# Patient Record
Sex: Female | Born: 1980 | Race: Black or African American | Hispanic: No | Marital: Married | Smoking: Never smoker
Health system: Southern US, Community
[De-identification: ages and names within clinical notes are randomized; demographics above are authoritative.]

## PROBLEM LIST (undated history)

## (undated) DIAGNOSIS — I10 Essential (primary) hypertension: Secondary | ICD-10-CM

## (undated) DIAGNOSIS — F419 Anxiety disorder, unspecified: Secondary | ICD-10-CM

## (undated) DIAGNOSIS — J45909 Unspecified asthma, uncomplicated: Secondary | ICD-10-CM

---

## 2018-02-22 ENCOUNTER — Encounter (HOSPITAL_COMMUNITY): Payer: Self-pay

## 2018-02-22 ENCOUNTER — Other Ambulatory Visit: Payer: Self-pay

## 2018-02-22 ENCOUNTER — Emergency Department (HOSPITAL_COMMUNITY)
Admission: EM | Admit: 2018-02-22 | Discharge: 2018-02-22 | Disposition: A | Discharge status: Home / Self Care | Payer: 59 | Attending: Emergency Medicine | Admitting: Emergency Medicine

## 2018-02-22 ENCOUNTER — Emergency Department (HOSPITAL_COMMUNITY): Payer: 59

## 2018-02-22 DIAGNOSIS — I10 Essential (primary) hypertension: Secondary | ICD-10-CM | POA: Diagnosis not present

## 2018-02-22 DIAGNOSIS — R002 Palpitations: Secondary | ICD-10-CM | POA: Diagnosis not present

## 2018-02-22 DIAGNOSIS — J45909 Unspecified asthma, uncomplicated: Secondary | ICD-10-CM | POA: Insufficient documentation

## 2018-02-22 DIAGNOSIS — R079 Chest pain, unspecified: Secondary | ICD-10-CM | POA: Diagnosis present

## 2018-02-22 HISTORY — DX: Unspecified asthma, uncomplicated: J45.909

## 2018-02-22 HISTORY — DX: Anxiety disorder, unspecified: F41.9

## 2018-02-22 HISTORY — DX: Essential (primary) hypertension: I10

## 2018-02-22 LAB — BASIC METABOLIC PANEL
Anion gap: 7 (ref 5–15)
BUN: 13 mg/dL (ref 6–20)
CO2: 25 mmol/L (ref 22–32)
Calcium: 8.8 mg/dL — ABNORMAL LOW (ref 8.9–10.3)
Chloride: 104 mmol/L (ref 98–111)
Creatinine, Ser: 0.92 mg/dL (ref 0.44–1.00)
GFR calc Af Amer: >60 mL/min (ref >60)
GFR calc non Af Amer: >60 mL/min (ref >60)
Glucose, Bld: 93 mg/dL (ref 70–99)
Potassium: 3.2 mmol/L — ABNORMAL LOW (ref 3.5–5.1)
Sodium: 136 mmol/L (ref 135–145)

## 2018-02-22 LAB — I-STAT BETA HCG BLOOD, ED (MC, WL, AP ONLY): I-stat hCG, quantitative: <5 m[IU]/mL (ref <5)

## 2018-02-22 LAB — CBC
HCT: 37.5 % (ref 36.0–46.0)
Hemoglobin: 11.3 g/dL — ABNORMAL LOW (ref 12.0–15.0)
MCH: 23.3 pg — ABNORMAL LOW (ref 26.0–34.0)
MCHC: 30.1 g/dL (ref 30.0–36.0)
MCV: 77.5 fL — ABNORMAL LOW (ref 80.0–100.0)
Platelets: 486 10*3/uL — ABNORMAL HIGH (ref 150–400)
RBC: 4.84 MIL/uL (ref 3.87–5.11)
RDW: 15.9 % — ABNORMAL HIGH (ref 11.5–15.5)
WBC: 13.8 10*3/uL — ABNORMAL HIGH (ref 4.0–10.5)
nRBC: 0 % (ref 0.0–0.2)

## 2018-02-22 LAB — TROPONIN I: Troponin I: <0.03 ng/mL (ref <0.03)

## 2018-02-22 MED ORDER — POTASSIUM CHLORIDE CRYS ER 20 MEQ PO TBCR
40.0000 meq | EXTENDED_RELEASE_TABLET | Freq: Once | ORAL | Status: AC
  Administered 2018-02-22: 40 meq via ORAL
  Filled 2018-02-22: qty 2

## 2018-02-22 NOTE — ED Provider Notes (Signed)
[facility] EMERGENCY DEPARTMENT Provider Note   CSN: [id number] Arrival date & time: 02/22/18  1728     History   Chief Complaint Chief Complaint  Patient presents with  . Chest Pain    HPI Weldon InchesLatoya Harding is a 37 y.o. female.  HPI   37 year old female with palpitations.  She will occasionally feel like her heart skips a beat.  She has not noticed any appreciable exacerbating relieving factors to this.  No sustained palpitations.  No shortness of breath.  No fevers or chills.  No unusual leg pain or swelling.  Denies any new medications over-the-counter or otherwise.  Denies heavy caffeine usage.  Denies illicit drug use.  Past Medical History:  Diagnosis Date  . Anxiety   . Asthma   . Hypertension     There are no active problems to display for this patient.   History reviewed. No pertinent surgical history.   OB History   No obstetric history on file.      Home Medications    Prior to Admission medications   Not on File    Family History No family history on file.  Social History Social History   Tobacco Use  . Smoking status: Never Smoker  Substance Use Topics  . Alcohol use: Never    Frequency: Never  . Drug use: Never     Allergies   Aspirin   Review of Systems Review of Systems  All systems reviewed and negative, other than as noted in HPI.  Physical Exam Updated Vital Signs BP 122/75 (BP Location: Right Arm)   Pulse (!) 115   Temp 98.2 F (36.8 C)   Resp 12   Ht 5\' 4"  (1.626 m)   Wt 127 kg   LMP 02/21/2018   SpO2 95%   BMI 48.06 kg/m   Physical Exam Vitals signs and nursing note reviewed.  Constitutional:      General: She is not in acute distress.    Appearance: She is well-developed. She is obese. She is not ill-appearing, toxic-appearing or diaphoretic.  HENT:     Head: Normocephalic and atraumatic.  Eyes:     General:        Right eye: No discharge.        Left eye: No discharge.     Conjunctiva/sclera:  Conjunctivae normal.  Neck:     Musculoskeletal: Neck supple.  Cardiovascular:     Rate and Rhythm: Normal rate and regular rhythm.     Heart sounds: Normal heart sounds. No murmur. No friction rub. No gallop.   Pulmonary:     Effort: Pulmonary effort is normal. No respiratory distress.     Breath sounds: Normal breath sounds.  Abdominal:     General: There is no distension.     Palpations: Abdomen is soft.     Tenderness: There is no abdominal tenderness.  Musculoskeletal:        General: No tenderness.     Comments: Lower extremities symmetric as compared to each other. No calf tenderness. Negative Homan's. No palpable cords.   Skin:    General: Skin is warm and dry.  Neurological:     Mental Status: She is alert.  Psychiatric:        Behavior: Behavior normal.        Thought Content: Thought content normal.      ED Treatments / Results  Labs (all labs ordered are listed, but only abnormal results are displayed) Labs Reviewed  BASIC METABOLIC PANEL -  Abnormal; Notable for the following components:      Result Value   Potassium 3.2 (*)    Calcium 8.8 (*)    All other components within normal limits  CBC - Abnormal; Notable for the following components:   WBC 13.8 (*)    Hemoglobin 11.3 (*)    MCV 77.5 (*)    MCH 23.3 (*)    RDW 15.9 (*)    Platelets 486 (*)    All other components within normal limits  TROPONIN I  I-STAT BETA HCG BLOOD, ED ([facility], [facility], [facility] ONLY)    EKG EKG Interpretation  Date/Time:  Sunday February 22 2018 17:42:52 EST Ventricular Rate:  103 PR Interval:  164 QRS Duration: 70 QT Interval:  356 QTC Calculation: 466 R Axis:   48 Text Interpretation:  Sinus tachycardia Otherwise normal ECG Confirmed by Jacey Pelc ([id number]) on 02/22/2018 6:38:39 PM   Radiology No results found.  Procedures Procedures (including critical care time)  Medications Ordered in ED Medications - No data to display   Initial Impression / Assessment and Plan /  ED Course  I have reviewed the triage vital signs and the nursing notes.  Pertinent labs & imaging results that were available during my care of the patient were reviewed by me and considered in my medical decision making (see chart for details).     37  year old female with palpitations.  From her description, I suspect she may be feeling PVCs.  No significant ectopy noted while in the emergency room though.  ED work-up fairly unremarkable aside from mild hypokalemia.  I doubt emergent process.  It has been determined that no acute conditions requiring further emergency intervention are present at this time. The patient has been advised of the diagnosis and plan. I reviewed any labs and imaging including any potential incidental findings. We have discussed signs and symptoms that warrant return to the ED and they are listed in the discharge instructions.    Final Clinical Impressions(s) / ED Diagnoses   Final diagnoses:  Palpitations    ED Discharge Orders    None       Raeford Razor, MD 03/05/18 [id number]

## 2018-02-22 NOTE — ED Triage Notes (Signed)
Pt reports chest pain for 3 days. Also had experienced anxiety for 3 days which is better. Pt reports that intermittent hard beats in mid chest. At times feels like chest is heavy

## 2019-07-10 IMAGING — DX DG CHEST 2V
2 series · 2 of 2 positions shown · non-contrast
Comparison: None.

CLINICAL DATA: Chest pain

EXAM:
CHEST - 2 VIEW

[chest pa]
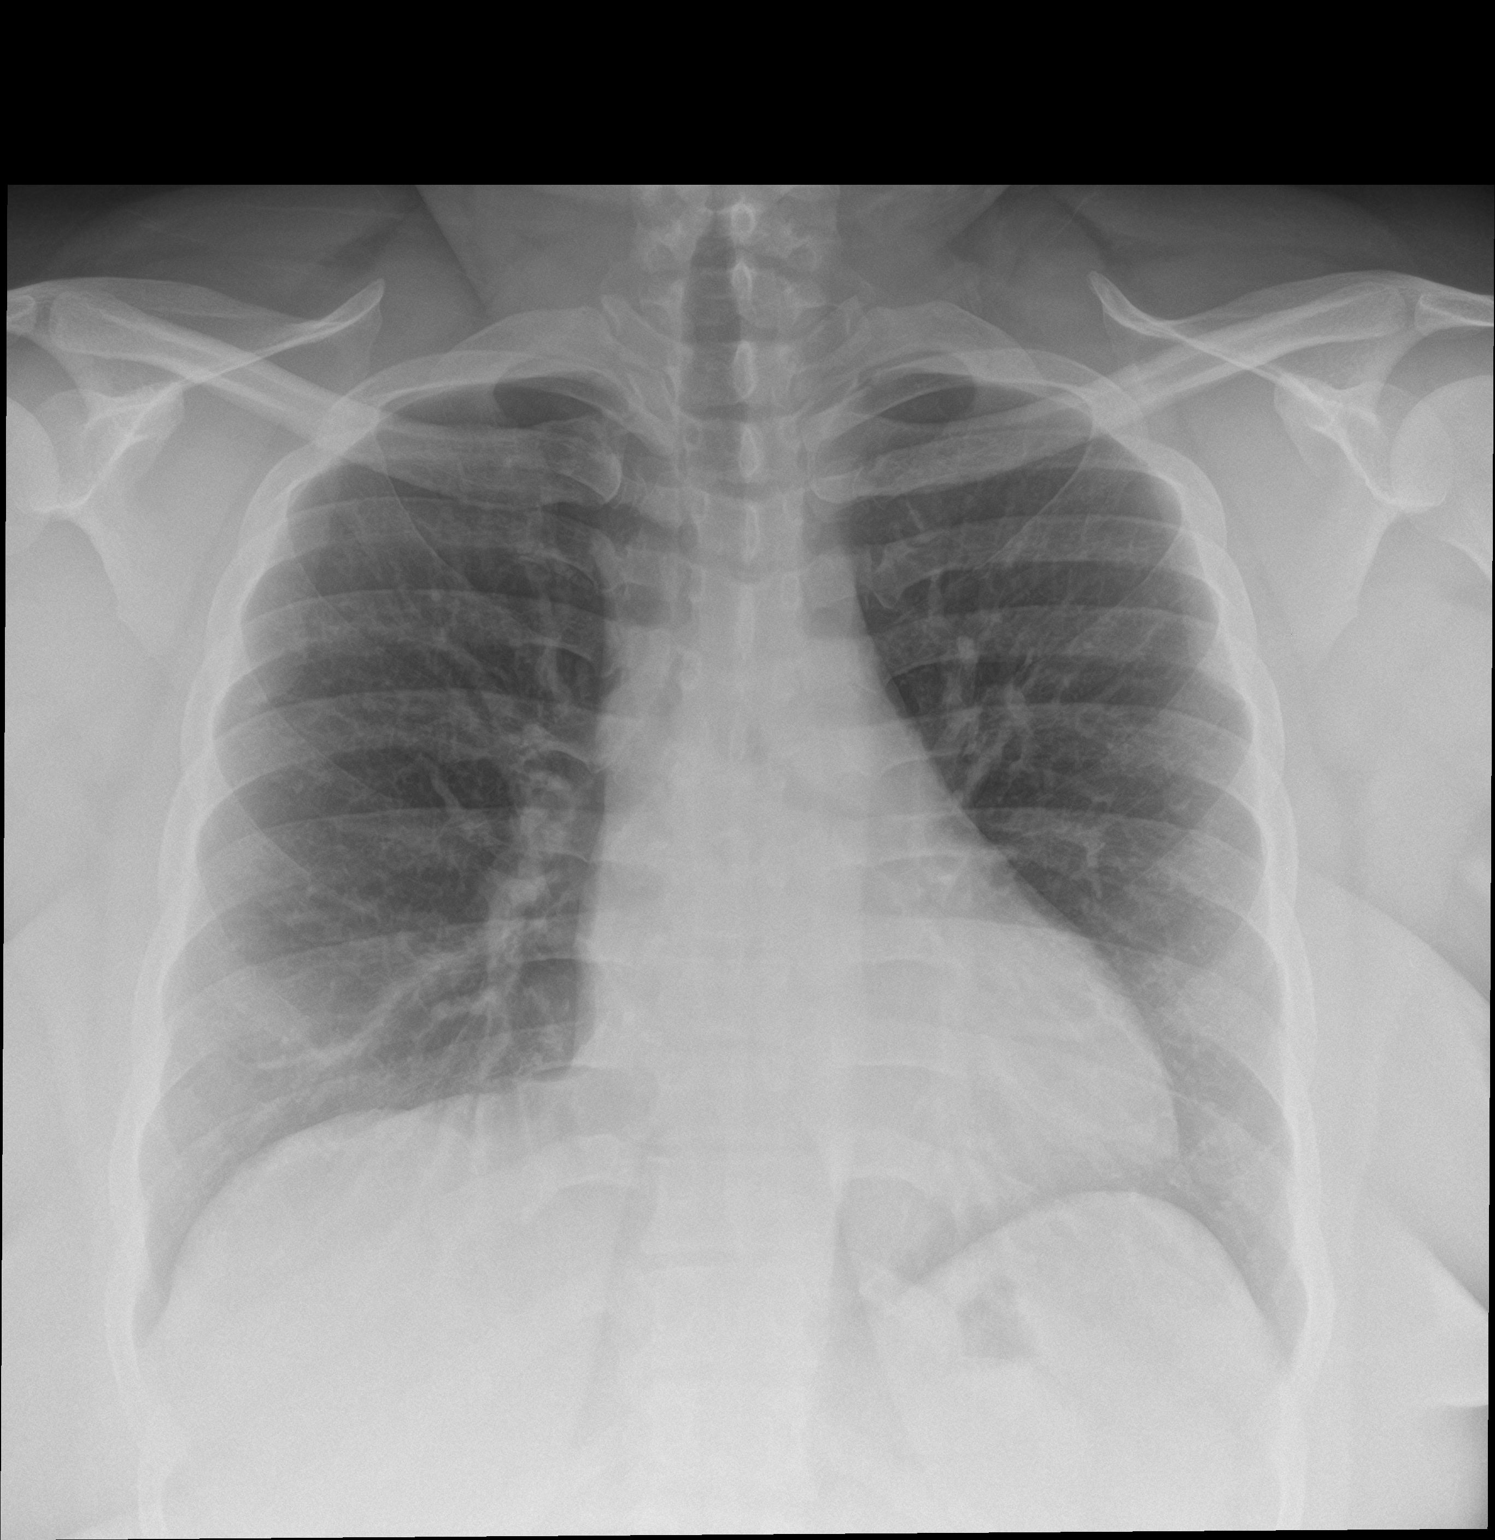

[chest lat]
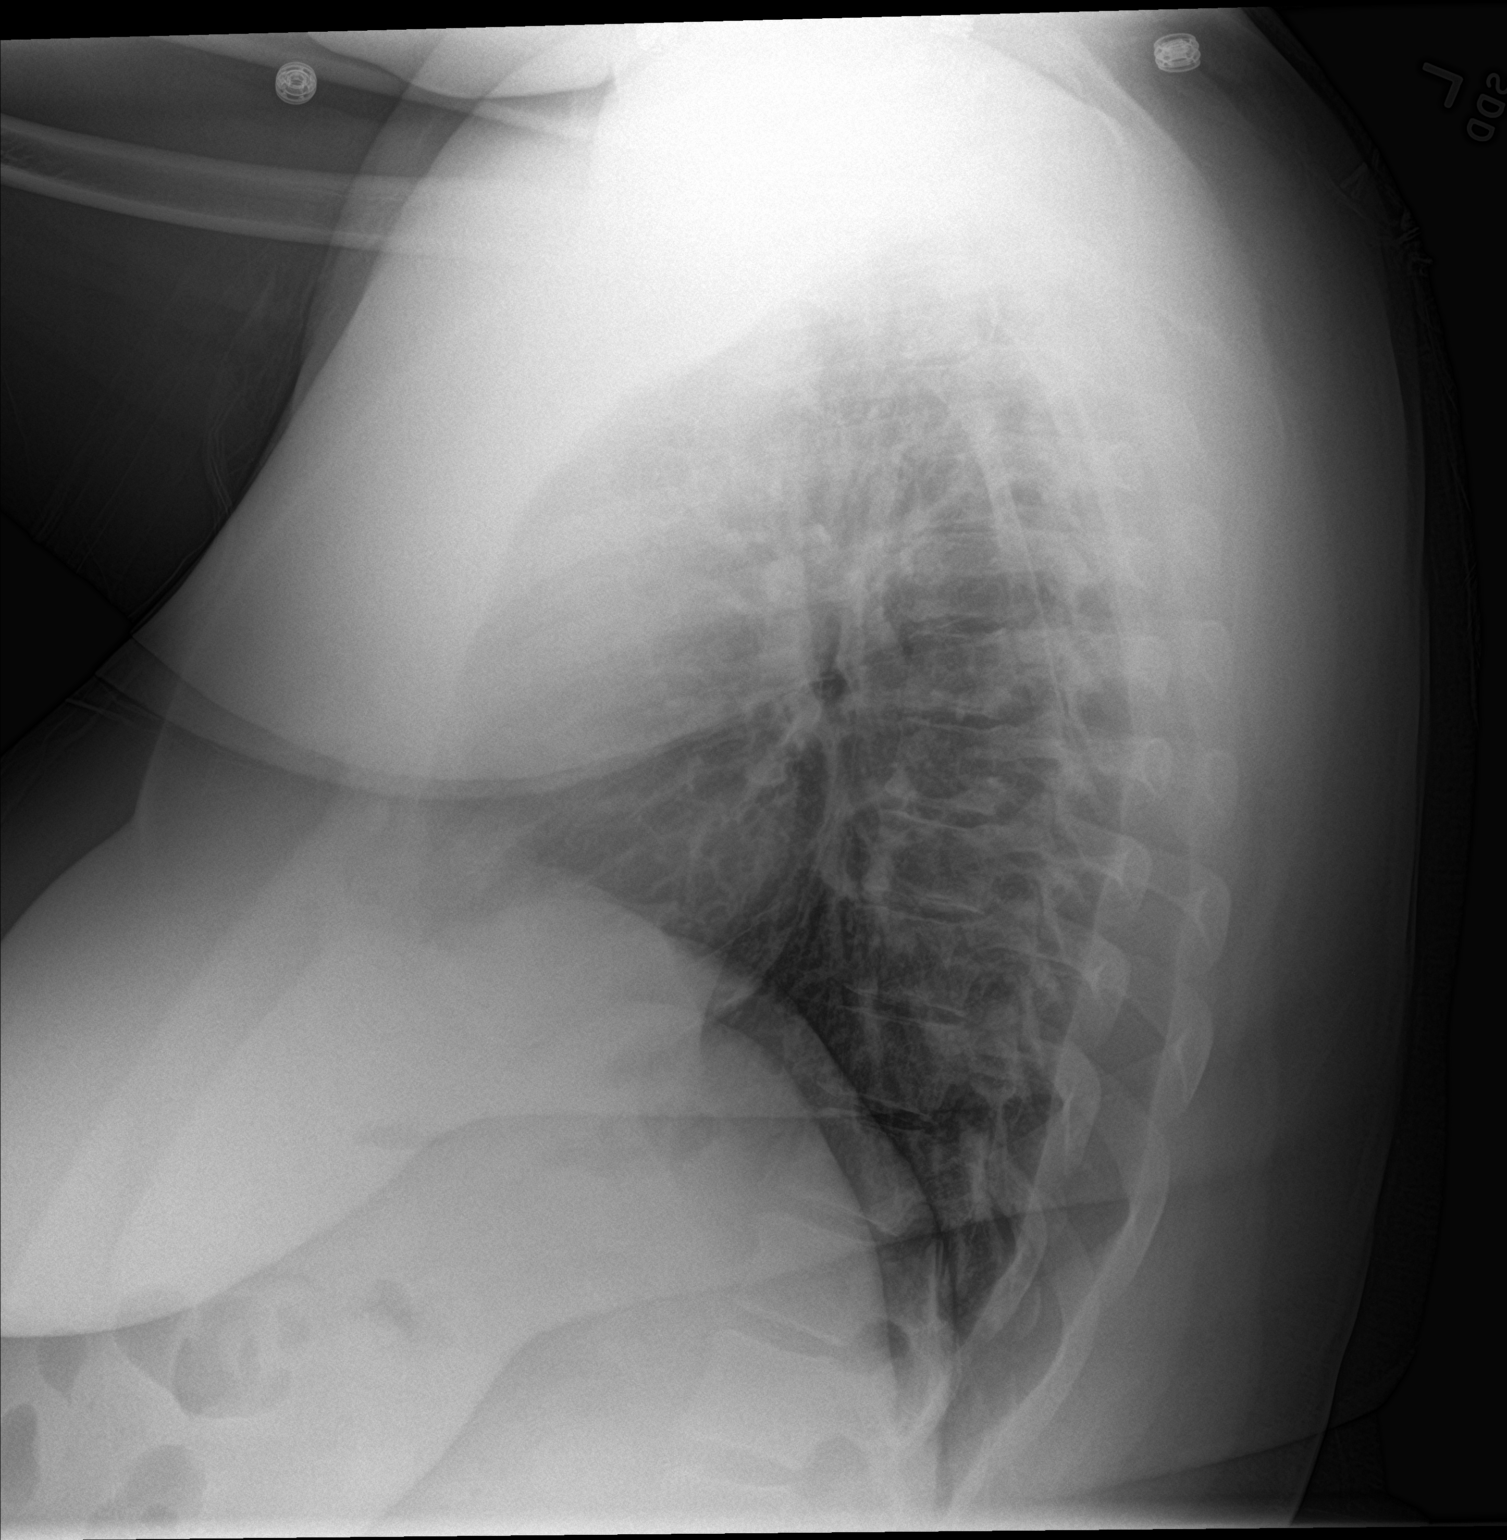

[2 of 2 positions shown; findings below may reference images not displayed]

FINDINGS: Heart and mediastinal contours are within normal limits. No focal
opacities or effusions. No acute bony abnormality.
IMPRESSION: No active cardiopulmonary disease.
